# Patient Record
Sex: Female | Born: 1995 | Race: White | Hispanic: No | Marital: Single | Smoking: Never smoker
Health system: Southern US, Community
[De-identification: ages and names within clinical notes are randomized; demographics above are authoritative.]

## PROBLEM LIST (undated history)

## (undated) DIAGNOSIS — O26892 Other specified pregnancy related conditions, second trimester: Secondary | ICD-10-CM

---

## 2017-04-12 ENCOUNTER — Ambulatory Visit (INDEPENDENT_AMBULATORY_CARE_PROVIDER_SITE_OTHER): Payer: BLUE CROSS/BLUE SHIELD | Admitting: Physician Assistant

## 2017-04-12 ENCOUNTER — Encounter: Payer: Self-pay | Admitting: Physician Assistant

## 2017-04-12 VITALS — BP 106/74 | HR 88 | Temp 98.7°F | Resp 18 | Ht 61.81 in | Wt 183.0 lb

## 2017-04-12 DIAGNOSIS — Z23 Encounter for immunization: Secondary | ICD-10-CM

## 2017-04-12 MED ORDER — LEVALBUTEROL HCL 1.25 MG/0.5ML IN NEBU: 1.2500 mg | INHALATION_SOLUTION | Freq: Once | RESPIRATORY_TRACT | Status: DC

## 2017-04-12 MED ORDER — IPRATROPIUM BROMIDE 0.02 % IN SOLN: 0.5000 mg | Freq: Once | RESPIRATORY_TRACT | Status: DC

## 2017-04-12 NOTE — Patient Instructions (Addendum)
Please return for hep b in 1 month and 6 months Gardasil will be in 2 months and 6 months.       IF you received an x-ray today, you will receive an invoice from [facility]. Please contact [facility] at [phone] with questions or concerns regarding your invoice.   IF you received labwork today, you will receive an invoice from [facility]. Please contact [facility] at [phone] with questions or concerns regarding your invoice.   Our billing staff will not be able to assist you with questions regarding bills from these companies.  You will be contacted with the lab results as soon as they are available. The fastest way to get your results is to activate your My Chart account. Instructions are located on the last page of this paperwork. If you have not heard from us regarding the results in 2 weeks, please contact this office.

## 2017-04-12 NOTE — Progress Notes (Signed)
PRIMARY CARE AT [place], [place] [address] [phone]  Date:  04/12/2017   Name:  Tara Chen   DOB:  1995-08-30   MRN:  [id number]  PCP:  Patient, No Pcp Per    History of Present Illness:  Tara Chen is a 21 y.o. female patient who presents to PCP with  Chief Complaint  Patient presents with  . Immunizations    Tdap, MMR, Hep b     Patient is here for the above immunizations for school she has only 3 noted immunizations.  She reported that she has travelled around a lot young from [place].  She reports that she is going to [facility]  She has never had her gardasil shots  There are no active problems to display for this patient.   History reviewed. No pertinent past medical history.  History reviewed. No pertinent surgical history.  Social History  Substance Use Topics  . Smoking status: Never Smoker  . Smokeless tobacco: Never Used  . Alcohol use No    History reviewed. No pertinent family history.  No Known Allergies  Medication list has been reviewed and updated.  No current outpatient prescriptions on file prior to visit.   No current facility-administered medications on file prior to visit.     ROS ROS otherwise unremarkable unless listed above.  Physical Examination: BP 106/74 (BP Location: Left Arm, Patient Position: Sitting, Cuff Size: Normal)   Pulse 88   Temp 98.7 F (37.1 C) (Oral)   Resp 18   Ht 5' 1.81" (1.57 m)   Wt 183 lb (83 kg)   LMP 03/27/2017 (Approximate)   SpO2 98%   BMI 33.68 kg/m  Ideal Body Weight: Weight in (lb) to have BMI = 25: 135.6  Physical Exam  Constitutional: She is oriented to person, place, and time. She appears well-developed and well-nourished. No distress.  HENT:  Head: Normocephalic and atraumatic.  Right Ear: External ear normal.  Left Ear: External ear normal.  Eyes: Pupils are equal, round, and reactive to light. Conjunctivae and EOM are normal.  Cardiovascular:  Normal rate.   Pulmonary/Chest: Effort normal. No respiratory distress.  Neurological: She is alert and oriented to person, place, and time.  Skin: She is not diaphoretic.  Psychiatric: She has a normal mood and affect. Her behavior is normal.     Assessment and Plan: Tara Chen is a 21 y.o. female who is here today for cc of  Chief Complaint  Patient presents with  . Immunizations    Tdap, MMR, Hep b  advised regimen of return for immunizations.  ncir obtained at today's visit.   Need for Tdap vaccination - Plan: Tdap vaccine greater than or equal to 7yo IM  Need for MMR vaccine - Plan: MMR vaccine subcutaneous  Need for hepatitis B booster vaccination - Plan: Hepatitis B vaccine adult IM  Need for HPV vaccination - Plan: HPV 9-valent vaccine,Recombinat  Ivar Drape, PA-C [facility] 10/19/20189:03 AM

## 2017-04-13 NOTE — Addendum Note (Signed)
Addended by: Trena PlattENGLISH, Earl Zellmer D on: 04/13/2017 09:07 AM   Modules accepted: Level of Service

## 2017-07-30 ENCOUNTER — Ambulatory Visit (INDEPENDENT_AMBULATORY_CARE_PROVIDER_SITE_OTHER): Payer: BLUE CROSS/BLUE SHIELD | Admitting: Physician Assistant

## 2017-07-30 DIAGNOSIS — Z23 Encounter for immunization: Secondary | ICD-10-CM | POA: Diagnosis not present

## 2017-07-30 DIAGNOSIS — Z1151 Encounter for screening for human papillomavirus (HPV): Secondary | ICD-10-CM

## 2017-07-30 NOTE — Patient Instructions (Addendum)
Please return for your final round of Hep B and HPV on July 5th.

## 2017-07-30 NOTE — Progress Notes (Signed)
Pt here for Hep B and HPV Vaccine. Pt should return in 5 months for last two in the series.

## 2017-09-25 ENCOUNTER — Encounter: Payer: Self-pay | Admitting: Physician Assistant

## 2018-04-23 ENCOUNTER — Encounter (HOSPITAL_COMMUNITY): Payer: Self-pay

## 2018-04-23 ENCOUNTER — Ambulatory Visit (HOSPITAL_COMMUNITY)
Admission: EM | Admit: 2018-04-23 | Discharge: 2018-04-23 | Disposition: A | Discharge status: Home / Self Care | Payer: BLUE CROSS/BLUE SHIELD | Attending: Family Medicine | Admitting: Family Medicine

## 2018-04-23 DIAGNOSIS — H9201 Otalgia, right ear: Secondary | ICD-10-CM

## 2018-04-23 MED ORDER — CETIRIZINE HCL 10 MG PO TABS: 10.0000 mg | ORAL_TABLET | Freq: Every day | ORAL | 0 refills | Status: DC

## 2018-04-23 MED ORDER — FLUTICASONE PROPIONATE 50 MCG/ACT NA SUSP: 2.0000 | Freq: Every day | NASAL | 0 refills | Status: DC

## 2018-04-23 NOTE — Discharge Instructions (Addendum)
No signs of infection. Start flonase and zyrtec as directed for possible eustachian tube dysfunction. Follow up with ENT if symptoms not improving.

## 2018-04-23 NOTE — ED Provider Notes (Signed)
[facility]    CSN: [id number] Arrival date & time: 04/23/18  1336     History   Chief Complaint Chief Complaint  Patient presents with  . Otalgia    HPI Tara Chen is a 22 y.o. female.   22 year old female comes in for right ear pain.  States this has been going on for a year intermittently.  Usually resolves after taking ibuprofen.  However, continues to have ear fullness.  States came in for evaluation as the past 2 days, symptoms has been pretty constant.  She denies current URI symptoms such as rhinorrhea, nasal congestion, sore throat, cough.  Denies fever, chills, night sweats.  She does notice ear popping with eating, chewing.  Usually symptoms is worse when she is on the plane.  Denies recent swimming.     History reviewed. No pertinent past medical history.  There are no active problems to display for this patient.   History reviewed. No pertinent surgical history.  OB History   None      Home Medications    Prior to Admission medications   Medication Sig Start Date End Date Taking? Authorizing Provider  cetirizine (ZYRTEC) 10 MG tablet Take 1 tablet (10 mg total) by mouth daily. 04/23/18   Cathie Hoops, Zarriah Starkel V, PA-C  fluticasone (FLONASE) 50 MCG/ACT nasal spray Place 2 sprays into both nostrils daily. 04/23/18   Cathie Hoops, Keisean Skowron V, PA-C  lisdexamfetamine (VYVANSE) 20 MG capsule Take 20 mg by mouth daily.    [provider]  Norgestim-Eth Estrad Triphasic (TRI-PREVIFEM PO) Take by mouth.    [provider]    Family History History reviewed. No pertinent family history.  Social History Social History   Tobacco Use  . Smoking status: Never Smoker  . Smokeless tobacco: Never Used  Substance Use Topics  . Alcohol use: No  . Drug use: No     Allergies   Patient has no known allergies.   Review of Systems Review of Systems  Reason unable to perform ROS: See HPI as above.     Physical Exam Triage Vital Signs ED Triage Vitals    Enc Vitals Group     BP 04/23/18 1436 108/89     Pulse Rate 04/23/18 1436 69     Resp 04/23/18 1436 20     Temp 04/23/18 1436 98 F (36.7 C)     Temp Source 04/23/18 1436 Oral     SpO2 04/23/18 1436 100 %     Weight --      Height --      Head Circumference --      Peak Flow --      Pain Score 04/23/18 1437 6     Pain Loc --      Pain Edu? --      Excl. in GC? --    No data found.  Updated Vital Signs BP 108/89 (BP Location: Right Arm)   Pulse 69   Temp 98 F (36.7 C) (Oral)   Resp 20   LMP 04/19/2018   SpO2 100%   Physical Exam  Constitutional: She is oriented to person, place, and time. She appears well-developed and well-nourished. No distress.  HENT:  Head: Normocephalic and atraumatic.  Right Ear: Tympanic membrane, external ear and ear canal normal. Tympanic membrane is not erythematous and not bulging.  Left Ear: Tympanic membrane, external ear and ear canal normal. Tympanic membrane is not erythematous and not bulging.  Nose: Nose normal. Right sinus exhibits no  maxillary sinus tenderness and no frontal sinus tenderness. Left sinus exhibits no maxillary sinus tenderness and no frontal sinus tenderness.  Mouth/Throat: Uvula is midline, oropharynx is clear and moist and mucous membranes are normal.  Eyes: Pupils are equal, round, and reactive to light. Conjunctivae are normal.  Neck: Normal range of motion. Neck supple.  Cardiovascular: Normal rate, regular rhythm and normal heart sounds. Exam reveals no gallop and no friction rub.  No murmur heard. Pulmonary/Chest: Effort normal and breath sounds normal. She has no decreased breath sounds. She has no wheezes. She has no rhonchi. She has no rales.  Lymphadenopathy:    She has no cervical adenopathy.  Neurological: She is alert and oriented to person, place, and time.  Skin: Skin is warm and dry.  Psychiatric: She has a normal mood and affect. Her behavior is normal. Judgment normal.     UC Treatments / Results   Labs (all labs ordered are listed, but only abnormal results are displayed) Labs Reviewed - No data to display  EKG None  Radiology No results found.  Procedures Procedures (including critical care time)  Medications Ordered in UC Medications - No data to display  Initial Impression / Assessment and Plan / UC Course  I have reviewed the triage vital signs and the nursing notes.  Pertinent labs & imaging results that were available during my care of the patient were reviewed by me and considered in my medical decision making (see chart for details).    Will treat for possible eustachian tube dysfunction.  Flonase and Zyrtec as directed.  Information on eustachian tube dysfunction provided.  Patient to follow-up with ENT for further evaluation if symptoms not improving.  Resources provided.  Patient expresses understanding and agrees to plan.  Final Clinical Impressions(s) / UC Diagnoses   Final diagnoses:  Right ear pain    ED Prescriptions    Medication Sig Dispense Auth. Provider   fluticasone (FLONASE) 50 MCG/ACT nasal spray Place 2 sprays into both nostrils daily. 1 g Thelton Graca V, PA-C   cetirizine (ZYRTEC) 10 MG tablet Take 1 tablet (10 mg total) by mouth daily. 15 tablet Threasa Alpha, [place] 04/23/18 [phone]

## 2018-04-23 NOTE — ED Triage Notes (Signed)
Pt presents with an ongoing right ear pain.

## 2018-05-30 ENCOUNTER — Emergency Department (HOSPITAL_COMMUNITY): Payer: Worker's Compensation

## 2018-05-30 ENCOUNTER — Emergency Department (HOSPITAL_COMMUNITY)
Admission: EM | Admit: 2018-05-30 | Discharge: 2018-05-31 | Disposition: A | Discharge status: Home / Self Care | Payer: Worker's Compensation | Attending: Emergency Medicine | Admitting: Emergency Medicine

## 2018-05-30 ENCOUNTER — Encounter (HOSPITAL_COMMUNITY): Payer: Self-pay | Admitting: Emergency Medicine

## 2018-05-30 ENCOUNTER — Other Ambulatory Visit: Payer: Self-pay

## 2018-05-30 DIAGNOSIS — Y990 Civilian activity done for income or pay: Secondary | ICD-10-CM | POA: Insufficient documentation

## 2018-05-30 DIAGNOSIS — Z79899 Other long term (current) drug therapy: Secondary | ICD-10-CM | POA: Insufficient documentation

## 2018-05-30 DIAGNOSIS — Y9259 Other trade areas as the place of occurrence of the external cause: Secondary | ICD-10-CM | POA: Diagnosis not present

## 2018-05-30 DIAGNOSIS — S81812A Laceration without foreign body, left lower leg, initial encounter: Secondary | ICD-10-CM

## 2018-05-30 DIAGNOSIS — W25XXXA Contact with sharp glass, initial encounter: Secondary | ICD-10-CM | POA: Insufficient documentation

## 2018-05-30 DIAGNOSIS — Y939 Activity, unspecified: Secondary | ICD-10-CM | POA: Insufficient documentation

## 2018-05-30 MED ORDER — LIDOCAINE HCL 2 % IJ SOLN
10.0000 mL | Freq: Once | INTRAMUSCULAR | Status: AC
  Administered 2018-05-30: 200 mg
  Filled 2018-05-30: qty 20

## 2018-05-30 NOTE — ED Triage Notes (Signed)
Pt reports laceration to L outer lower leg that happened 1 hour ago, bleeding controlled. Pt reports she was lifting a box of glass bottles, didn't realize it was open on both sides and they fell out and cut her. Pt reports tetanus was received in last 1-2 years.

## 2018-05-30 NOTE — ED Provider Notes (Signed)
[facility]ENC[facility] Provider Note   CSN: [id number] Arrival date & time: 05/30/18  2021     History   Chief Complaint Chief Complaint  Patient presents with  . Laceration    HPI Tara Chen is a 22 y.o. female.  HPI  Patient is a 22 year old female with no significant past medical history presenting for laceration to the anterior lower extremity.  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  Patient reports bleeding was controlled with bandage and compression at the scene.  Patient denies any difficulty performing dorsiflexion or plantarflexion of the left lower extremity.  Denies numbness distal to the injury.  Last tetanus shot 1 to 2 years ago.  History reviewed. No pertinent past medical history.  There are no active problems to display for this patient.   History reviewed. No pertinent surgical history.   OB History   None      Home Medications    Prior to Admission medications   Medication Sig Start Date End Date Taking? Authorizing Provider  cetirizine (ZYRTEC) 10 MG tablet Take 1 tablet (10 mg total) by mouth daily. 04/23/18   Cathie HoopsYu, Amy V, PA-C  fluticasone (FLONASE) 50 MCG/ACT nasal spray Place 2 sprays into both nostrils daily. 04/23/18   Cathie HoopsYu, Amy V, PA-C  lisdexamfetamine (VYVANSE) 20 MG capsule Take 20 mg by mouth daily.    [provider]  Norgestim-Eth Estrad Triphasic (TRI-PREVIFEM PO) Take by mouth.    [provider]    Family History History reviewed. No pertinent family history.  Social History Social History   Tobacco Use  . Smoking status: Never Smoker  . Smokeless tobacco: Never Used  Substance Use Topics  . Alcohol use: No  . Drug use: No     Allergies   Patient has no known allergies.   Review of Systems Review of Systems    Musculoskeletal: Negative for arthralgias and myalgias.  Skin: Positive for wound.  Neurological: Negative for weakness and numbness.     Physical Exam Updated Vital Signs BP 121/81   Pulse 89   Temp 98.1 F (36.7 C) (Oral)   Resp 18   LMP 04/26/2018   SpO2 100%   Physical Exam  Constitutional: She appears well-developed and well-nourished. No distress.  Sitting comfortably in bed.  HENT:  Head: Normocephalic and atraumatic.  Eyes: Conjunctivae are normal. Right eye exhibits no discharge. Left eye exhibits no discharge.  EOMs normal to gross examination.  Neck: Normal range of motion.  Cardiovascular: Normal rate and regular rhythm.  Intact, 2+ DP pulse of LLE.   Pulmonary/Chest:  Normal respiratory effort. Patient converses comfortably. No audible wheeze or stridor.  Abdominal: She exhibits no distension.  Musculoskeletal: Normal range of motion.  Patient with full capacity to dorsiflex and plantarflex the left ankle.  Compartments of the left lower extremity are soft.  2+ left DP pulse distally.  Neurological: She is alert.  Cranial nerves intact to gross observation. Patient moves extremities without difficulty.  Skin: Skin is warm and dry. She is not diaphoretic.  Patient has a 3 cm linear laceration of the left shin.  Extends to subcutaneous tissue.  No muscle exposure.  Psychiatric: She has a normal mood and affect. Her behavior is normal. Judgment and thought content normal.  Nursing  note and vitals reviewed.    ED Treatments / Results  Labs (all labs ordered are listed, but only abnormal results are displayed) Labs Reviewed - No data to display  EKG None  Radiology Dg Tibia/fibula Left  Result Date: 05/30/2018 CLINICAL DATA:  22 year old female with laceration of the left lower extremity with glass. EXAM: LEFT TIBIA AND FIBULA - 2 VIEW COMPARISON:  None. FINDINGS: There is no acute fracture or dislocation. The bones are well mineralized. No arthritic  changes. Laceration of the skin anterior to the mid shin. No radiopaque foreign object. IMPRESSION: 1. No acute/traumatic osseous pathology. 2. No radiopaque foreign body. Electronically Signed   By: Elgie Collard M.D.   On: 05/30/2018 22:01    Procedures .Marland KitchenLaceration Repair Date/Time: 05/30/2018 11:51 PM Performed by: Elisha Ponder, PA-C Authorized by: Elisha Ponder, PA-C   Consent:    Consent obtained:  Verbal   Consent given by:  Patient   Risks discussed:  Pain and infection Anesthesia (see MAR for exact dosages):    Anesthesia method:  Local infiltration   Local anesthetic:  Lidocaine 2% w/o epi Laceration details:    Location:  Leg   Leg location:  L lower leg   Length (cm):  3 Repair type:    Repair type:  Simple Pre-procedure details:    Preparation:  Patient was prepped and draped in usual sterile fashion Exploration:    Hemostasis achieved with:  Direct pressure   Wound exploration: wound explored through full range of motion     Wound extent: no muscle damage noted and no tendon damage noted     Contaminated: no   Treatment:    Area cleansed with:  Saline and Betadine   Amount of cleaning:  Standard   Irrigation solution:  Sterile saline   Irrigation volume:  500 ml   Irrigation method:  Pressure wash Approximation:    Approximation:  Close Post-procedure details:    Dressing:  Non-adherent dressing   Patient tolerance of procedure:  Tolerated well, no immediate complications   (including critical care time)  Medications Ordered in ED Medications  lidocaine (XYLOCAINE) 2 % (with pres) injection 200 mg (200 mg Infiltration Given by Other 05/30/18 2327)     Initial Impression / Assessment and Plan / ED Course  I have reviewed the triage vital signs and the nursing notes.  Pertinent labs & imaging results that were available during my care of the patient were reviewed by me and considered in my medical decision making (see chart for details).      Patient well-appearing and neurovascularly intact in the left lower extremity.  Patient with superficial 3 similar laceration to the left shin.  Closed primarily with nylon suture.  Patient given suture care instructions, and instructed to return for any signs of infection including erythema, swelling or drainage.  Patient instructed to follow-up for suture removal in 7 days.  Pt denies chance of pregnancy. Recommend ibuprofen and Tylenol for symptomatic pain control.  Patient is in understanding and agrees with plan of care.  Final Clinical Impressions(s) / ED Diagnoses   Final diagnoses:  Laceration of left lower leg, initial encounter    ED Discharge Orders    None       Delia Chimes 05/30/18 2354    Azalia Bilis, MD 05/31/18 [id number]

## 2018-05-30 NOTE — Discharge Instructions (Signed)
Please see the information and instructions below regarding your visit.  Your diagnoses today include:  1. Laceration of left lower leg, initial encounter     Tests performed today include: X-ray of the affected area that did not show any foreign bodies or broken bones Vital signs. See below for your results today.   Medications prescribed:   Take any prescribed medications only as directed.  Ibuprofen alternating with Tylenol for pain.   I recommend ibuprofen a non-steroidal anti-inflammatory agent (NSAID) for pain. You may take 600 mg every 6 hours as needed for pain. If still requiring this medication around the clock for acute pain after 10 days, please see your primary healthcare provider.  Women who are pregnant, breastfeeding, or planning on becoming pregnant should not take non-steroidal anti-inflammatories such as Advil and Aleve. Tylenol is a safe over the counter pain reliever in pregnant women.  You may combine this medication with Tylenol, 650 mg every 6 hours, so you are receiving something for pain every 3 hours.  This is not a long-term medication unless under the care and direction of your primary provider. Taking this medication long-term and not under the supervision of a healthcare provider could increase the risk of stomach ulcers, kidney problems, and cardiovascular problems such as high blood pressure.    Home care instructions:  Follow any educational materials and wound care instructions contained in this packet.   Keep affected area above the level of your heart when possible to minimize swelling. Wash area gently twice a day with warm soapy water. Do not apply alcohol or hydrogen peroxide directly over a wound. Cover the area if it is draining or weeping. Keep the bandage in place for 24 hours and refrain from getting the wound wet for 24 hours. After that, you may get the area wet, but please ensure that you dry it completely afterwards.  Please refrain from  soaking sutures for long periods of time, or swimming in chlorinated water   You may apply antibiotic ointment such as Bacitracin or Neosporin.  Follow-up instructions: Suture Removal: Return to the Emergency Department or see your primary care care doctor in 7 days for a recheck of your wound and removal of your sutures or staples.    Return instructions:  Return to the Emergency Department if you have: Fever Worsening pain Worsening swelling of the wound Pus draining from the wound Redness of the skin that moves away from the wound, especially if it streaks away from the affected area  Any other emergent concerns  Your vital signs today were: BP 121/81    Pulse 89    Temp 98.1 F (36.7 C) (Oral)    Resp 18    LMP 04/26/2018    SpO2 100%  If your blood pressure (BP) was elevated on multiple readings during this visit above 130 for the top number or above 80 for the bottom number, please have this repeated by your primary care provider within one month. --------------  Thank you for allowing us to participate in your care today! It was a pleasure taking care of you.

## 2018-05-30 NOTE — ED Notes (Signed)
Patient transported to X-ray 

## 2018-06-06 ENCOUNTER — Ambulatory Visit (HOSPITAL_COMMUNITY): Admission: EM | Admit: 2018-06-06 | Discharge: 2018-06-06 | Discharge status: Absent without leave | Payer: Self-pay

## 2018-09-04 ENCOUNTER — Ambulatory Visit: Payer: BLUE CROSS/BLUE SHIELD | Admitting: Family Medicine

## 2018-09-09 ENCOUNTER — Inpatient Hospital Stay: Payer: BLUE CROSS/BLUE SHIELD

## 2018-09-09 NOTE — Other (Signed)
Pt arrives to triage with c/o cramping and green/white discharge that has a bad odor. Pt states she has not had an OB visit yet but has an appointment scheduled in [place]. States [facility]

## 2018-09-09 NOTE — Other (Signed)
Pt ambulated off of the unit undelivered

## 2018-09-09 NOTE — Other (Signed)
Pt discharged in ambulatory condition and notified to follow up with her OB provider. Pt states she has an appointment coming up in a week. All questions answered and written copy provided.

## 2018-09-09 NOTE — Other (Addendum)
Notified Dr. Mayford Knife of pt's cramping and discharge with bad odor, and that abdomen is palpating soft and no contractions seen on TOCO. Also notified him of pt's prenatal care status. Orders to discharge pt with follow up with her scheduled obstetrician.

## 2020-11-17 ENCOUNTER — Ambulatory Visit (HOSPITAL_COMMUNITY): Payer: Worker's Compensation | Admitting: Anesthesiology

## 2020-11-17 ENCOUNTER — Encounter (HOSPITAL_COMMUNITY): Payer: Self-pay | Admitting: Plastic Surgery

## 2020-11-17 ENCOUNTER — Emergency Department (HOSPITAL_COMMUNITY)
Admission: EM | Admit: 2020-11-17 | Discharge: 2020-11-17 | Payer: Worker's Compensation | Source: Home / Self Care | Attending: Emergency Medicine | Admitting: Emergency Medicine

## 2020-11-17 ENCOUNTER — Encounter (HOSPITAL_COMMUNITY)
Admission: RE | Disposition: A | Discharge status: Home / Self Care | Payer: Self-pay | Source: Ambulatory Visit | Attending: Plastic Surgery

## 2020-11-17 ENCOUNTER — Other Ambulatory Visit: Payer: Self-pay

## 2020-11-17 ENCOUNTER — Emergency Department (HOSPITAL_COMMUNITY): Payer: Worker's Compensation

## 2020-11-17 ENCOUNTER — Ambulatory Visit (HOSPITAL_COMMUNITY)
Admission: RE | Admit: 2020-11-17 | Discharge: 2020-11-17 | Disposition: A | Discharge status: Home / Self Care | Payer: Worker's Compensation | Source: Ambulatory Visit | Attending: Plastic Surgery | Admitting: Plastic Surgery

## 2020-11-17 DIAGNOSIS — S68629A Partial traumatic transphalangeal amputation of unspecified finger, initial encounter: Secondary | ICD-10-CM

## 2020-11-17 DIAGNOSIS — Z20822 Contact with and (suspected) exposure to covid-19: Secondary | ICD-10-CM | POA: Insufficient documentation

## 2020-11-17 DIAGNOSIS — S68122A Partial traumatic metacarpophalangeal amputation of right middle finger, initial encounter: Secondary | ICD-10-CM | POA: Insufficient documentation

## 2020-11-17 DIAGNOSIS — W230XXA Caught, crushed, jammed, or pinched between moving objects, initial encounter: Secondary | ICD-10-CM | POA: Insufficient documentation

## 2020-11-17 DIAGNOSIS — S68622A Partial traumatic transphalangeal amputation of right middle finger, initial encounter: Secondary | ICD-10-CM | POA: Insufficient documentation

## 2020-11-17 DIAGNOSIS — Z79899 Other long term (current) drug therapy: Secondary | ICD-10-CM | POA: Diagnosis not present

## 2020-11-17 DIAGNOSIS — R52 Pain, unspecified: Secondary | ICD-10-CM

## 2020-11-17 HISTORY — PX: AMPUTATION: SHX166

## 2020-11-17 LAB — POCT PREGNANCY, URINE: Preg Test, Ur: NEGATIVE

## 2020-11-17 LAB — RESP PANEL BY RT-PCR (FLU A&B, COVID) ARPGX2
Influenza A by PCR: NEGATIVE
Influenza B by PCR: NEGATIVE
SARS Coronavirus 2 by RT PCR: NEGATIVE

## 2020-11-17 SURGERY — AMPUTATION DIGIT
Anesthesia: Monitor Anesthesia Care | Site: Hand | Laterality: Right

## 2020-11-17 MED ORDER — FENTANYL CITRATE (PF) 250 MCG/5ML IJ SOLN
INTRAMUSCULAR | Status: AC
  Filled 2020-11-17: qty 5

## 2020-11-17 MED ORDER — FENTANYL CITRATE (PF) 100 MCG/2ML IJ SOLN
INTRAMUSCULAR | Status: DC | PRN
  Administered 2020-11-17 (×5): 50 ug via INTRAVENOUS

## 2020-11-17 MED ORDER — CHLORHEXIDINE GLUCONATE 0.12 % MT SOLN
OROMUCOSAL | Status: AC
  Administered 2020-11-17: 15 mL via OROMUCOSAL
  Filled 2020-11-17: qty 15

## 2020-11-17 MED ORDER — KETOROLAC TROMETHAMINE 30 MG/ML IJ SOLN
30.0000 mg | Freq: Once | INTRAMUSCULAR | Status: DC | PRN
Start: 2020-11-17 — End: 2020-11-17

## 2020-11-17 MED ORDER — MIDAZOLAM HCL 5 MG/5ML IJ SOLN
INTRAMUSCULAR | Status: DC | PRN
  Administered 2020-11-17: 2 mg via INTRAVENOUS

## 2020-11-17 MED ORDER — LACTATED RINGERS IV SOLN: INTRAVENOUS | Status: DC

## 2020-11-17 MED ORDER — CEFAZOLIN SODIUM-DEXTROSE 2-4 GM/100ML-% IV SOLN
INTRAVENOUS | Status: AC
  Filled 2020-11-17: qty 100

## 2020-11-17 MED ORDER — ONDANSETRON HCL 4 MG/2ML IJ SOLN
INTRAMUSCULAR | Status: DC | PRN
  Administered 2020-11-17: 4 mg via INTRAVENOUS

## 2020-11-17 MED ORDER — CEFAZOLIN SODIUM-DEXTROSE 2-4 GM/100ML-% IV SOLN
2.0000 g | INTRAVENOUS | Status: AC
  Administered 2020-11-17: 2 g via INTRAVENOUS

## 2020-11-17 MED ORDER — OXYCODONE-ACETAMINOPHEN 5-325 MG PO TABS
1.0000 | ORAL_TABLET | Freq: Once | ORAL | Status: AC
Start: 2020-11-17 — End: 2020-11-17
  Administered 2020-11-17: 1 via ORAL

## 2020-11-17 MED ORDER — PROPOFOL 500 MG/50ML IV EMUL
INTRAVENOUS | Status: DC | PRN
  Administered 2020-11-17: 75 ug/kg/min via INTRAVENOUS

## 2020-11-17 MED ORDER — BUPIVACAINE HCL (PF) 0.25 % IJ SOLN
INTRAMUSCULAR | Status: DC | PRN
  Administered 2020-11-17: 15 mL

## 2020-11-17 MED ORDER — MEPERIDINE HCL 25 MG/ML IJ SOLN: 6.2500 mg | INTRAMUSCULAR | Status: DC | PRN

## 2020-11-17 MED ORDER — ACETAMINOPHEN 10 MG/ML IV SOLN: 1000.0000 mg | Freq: Once | INTRAVENOUS | Status: DC

## 2020-11-17 MED ORDER — MIDAZOLAM HCL 2 MG/2ML IJ SOLN
INTRAMUSCULAR | Status: AC
  Filled 2020-11-17: qty 2

## 2020-11-17 MED ORDER — LIDOCAINE 2% (20 MG/ML) 5 ML SYRINGE
INTRAMUSCULAR | Status: DC | PRN
  Administered 2020-11-17: 100 mg via INTRAVENOUS

## 2020-11-17 MED ORDER — EPHEDRINE SULFATE-NACL 50-0.9 MG/10ML-% IV SOSY
PREFILLED_SYRINGE | INTRAVENOUS | Status: DC | PRN
  Administered 2020-11-17: 5 mg via INTRAVENOUS

## 2020-11-17 MED ORDER — PROMETHAZINE HCL 25 MG/ML IJ SOLN: 6.2500 mg | INTRAMUSCULAR | Status: DC | PRN

## 2020-11-17 MED ORDER — HYDROCODONE-ACETAMINOPHEN 5-325 MG PO TABS: 1.0000 | ORAL_TABLET | ORAL | 0 refills | Status: DC | PRN

## 2020-11-17 MED ORDER — POVIDONE-IODINE 10 % EX SWAB
2.0000 "application " | Freq: Once | CUTANEOUS | Status: AC
  Administered 2020-11-17: 2 via TOPICAL

## 2020-11-17 MED ORDER — OXYCODONE HCL 5 MG PO TABS: 5.0000 mg | ORAL_TABLET | Freq: Once | ORAL | Status: DC | PRN

## 2020-11-17 MED ORDER — 0.9 % SODIUM CHLORIDE (POUR BTL) OPTIME
TOPICAL | Status: DC | PRN
  Administered 2020-11-17: 1000 mL

## 2020-11-17 MED ORDER — OXYCODONE-ACETAMINOPHEN 5-325 MG PO TABS
ORAL_TABLET | ORAL | Status: AC
  Filled 2020-11-17: qty 1

## 2020-11-17 MED ORDER — CHLORHEXIDINE GLUCONATE 0.12 % MT SOLN: 15.0000 mL | Freq: Once | OROMUCOSAL | Status: AC

## 2020-11-17 MED ORDER — BUPIVACAINE-EPINEPHRINE (PF) 0.25% -1:200000 IJ SOLN
INTRAMUSCULAR | Status: AC
  Filled 2020-11-17: qty 60

## 2020-11-17 MED ORDER — PROPOFOL 10 MG/ML IV BOLUS
INTRAVENOUS | Status: DC | PRN
  Administered 2020-11-17: 30 mg via INTRAVENOUS

## 2020-11-17 MED ORDER — ORAL CARE MOUTH RINSE
15.0000 mL | Freq: Once | OROMUCOSAL | Status: AC
Start: 2020-11-17 — End: 2020-11-17

## 2020-11-17 MED ORDER — CHLORHEXIDINE GLUCONATE 4 % EX LIQD: 60.0000 mL | Freq: Once | CUTANEOUS | Status: DC

## 2020-11-17 MED ORDER — HYDROMORPHONE HCL 1 MG/ML IJ SOLN: 0.2500 mg | INTRAMUSCULAR | Status: DC | PRN

## 2020-11-17 MED ORDER — OXYCODONE HCL 5 MG/5ML PO SOLN: 5.0000 mg | Freq: Once | ORAL | Status: DC | PRN

## 2020-11-17 SURGICAL SUPPLY — 41 items
BNDG COHESIVE 1X5 TAN STRL LF (GAUZE/BANDAGES/DRESSINGS) ×2 IMPLANT
BNDG CONFORM 2 STRL LF (GAUZE/BANDAGES/DRESSINGS) ×2 IMPLANT
BNDG ELASTIC 2X5.8 VLCR STR LF (GAUZE/BANDAGES/DRESSINGS) ×2 IMPLANT
BNDG ELASTIC 3X5.8 VLCR STR LF (GAUZE/BANDAGES/DRESSINGS) IMPLANT
BNDG ELASTIC 4X5.8 VLCR STR LF (GAUZE/BANDAGES/DRESSINGS) IMPLANT
BNDG GAUZE ELAST 4 BULKY (GAUZE/BANDAGES/DRESSINGS) ×2 IMPLANT
CORD BIPOLAR FORCEPS 12FT (ELECTRODE) ×2 IMPLANT
COVER SURGICAL LIGHT HANDLE (MISCELLANEOUS) ×2 IMPLANT
COVER WAND RF STERILE (DRAPES) ×2 IMPLANT
CUFF TOURN SGL QUICK 18X4 (TOURNIQUET CUFF) ×2 IMPLANT
CUFF TOURN SGL QUICK 24 (TOURNIQUET CUFF)
CUFF TRNQT CYL 24X4X16.5-23 (TOURNIQUET CUFF) IMPLANT
DRAPE SURG 17X23 STRL (DRAPES) ×2 IMPLANT
DRSG ADAPTIC 3X8 NADH LF (GAUZE/BANDAGES/DRESSINGS) ×2 IMPLANT
GAUZE SPONGE 2X2 8PLY STRL LF (GAUZE/BANDAGES/DRESSINGS) IMPLANT
GAUZE SPONGE 4X4 12PLY STRL (GAUZE/BANDAGES/DRESSINGS) ×2 IMPLANT
GAUZE XEROFORM 1X8 LF (GAUZE/BANDAGES/DRESSINGS) ×2 IMPLANT
GOWN STRL REUS W/ TWL LRG LVL3 (GOWN DISPOSABLE) ×2 IMPLANT
GOWN STRL REUS W/ TWL XL LVL3 (GOWN DISPOSABLE) ×1 IMPLANT
GOWN STRL REUS W/TWL LRG LVL3 (GOWN DISPOSABLE) ×4
GOWN STRL REUS W/TWL XL LVL3 (GOWN DISPOSABLE) ×2
KIT BASIN OR (CUSTOM PROCEDURE TRAY) ×2 IMPLANT
KIT TURNOVER KIT B (KITS) ×2 IMPLANT
MANIFOLD NEPTUNE II (INSTRUMENTS) ×2 IMPLANT
NEEDLE HYPO 25GX1X1/2 BEV (NEEDLE) IMPLANT
NS IRRIG 1000ML POUR BTL (IV SOLUTION) ×2 IMPLANT
PACK GENERAL/GYN (CUSTOM PROCEDURE TRAY) ×2 IMPLANT
PAD ARMBOARD 7.5X6 YLW CONV (MISCELLANEOUS) ×4 IMPLANT
PAD CAST 4YDX4 CTTN HI CHSV (CAST SUPPLIES) IMPLANT
PADDING CAST COTTON 4X4 STRL (CAST SUPPLIES)
SOAP 2 % CHG 4 OZ (WOUND CARE) ×2 IMPLANT
SPONGE GAUZE 2X2 STER 10/PKG (GAUZE/BANDAGES/DRESSINGS)
SUCTION FRAZIER HANDLE 10FR (MISCELLANEOUS)
SUCTION TUBE FRAZIER 10FR DISP (MISCELLANEOUS) IMPLANT
SUT MERSILENE 4 0 P 3 (SUTURE) IMPLANT
SUT PROLENE 4 0 PS 2 18 (SUTURE) IMPLANT
SYR CONTROL 10ML LL (SYRINGE) IMPLANT
TOWEL GREEN STERILE (TOWEL DISPOSABLE) ×2 IMPLANT
TOWEL GREEN STERILE FF (TOWEL DISPOSABLE) ×2 IMPLANT
TUBE CONNECTING 12X1/4 (SUCTIONS) IMPLANT
WATER STERILE IRR 1000ML POUR (IV SOLUTION) ×2 IMPLANT

## 2020-11-17 NOTE — Discharge Instructions (Signed)
Activity: As tolerated, but avoid strenuous activity until follow up visit.  Diet: Regular  Wound Care: Keep dressing clean & dry for 2 days.  After that you can remove dressing and shower normally.  Redress the wound for protection until follow-up visit.  Special Instructions:  Call our office if any unusual problems occur such as pain, excessive bleeding, unrelieved nausea/vomiting, fever &/or chills.  Follow-up appointment: Scheduled for next week.

## 2020-11-17 NOTE — ED Provider Notes (Signed)
[facility]-EMERGENCY DEPT Provider Note   CSN: [id number] Arrival date & time: 11/17/20  1347     History No chief complaint on file.   Tara Chen is a 25 y.o. female.  Patient is a healthy 25 year old female who presents today after closing her finger in the door.  The tip of her right middle finger is dangling and has a laceration.  She has pain in the tip of her finger but no other injury.  Tetanus shot is up-to-date.  The history is provided by the patient.       No past medical history on file.  There are no problems to display for this patient.   No past surgical history on file.   OB History   No obstetric history on file.     No family history on file.  Social History   Tobacco Use  . Smoking status: Never Smoker  . Smokeless tobacco: Never Used  Vaping Use  . Vaping Use: Never used  Substance Use Topics  . Alcohol use: No  . Drug use: No    Home Medications Prior to Admission medications   Medication Sig Start Date End Date Taking? Authorizing Provider  cetirizine (ZYRTEC) 10 MG tablet Take 1 tablet (10 mg total) by mouth daily. 04/23/18   Cathie Hoops, Amy V, PA-C  fluticasone (FLONASE) 50 MCG/ACT nasal spray Place 2 sprays into both nostrils daily. 04/23/18   Cathie Hoops, Amy V, PA-C  lisdexamfetamine (VYVANSE) 20 MG capsule Take 20 mg by mouth daily.    [provider]  Norgestim-Eth Estrad Triphasic (TRI-PREVIFEM PO) Take by mouth.    [provider]    Allergies    Patient has no known allergies.  Review of Systems   Review of Systems  All other systems reviewed and are negative.   Physical Exam Updated Vital Signs BP 122/76 (BP Location: Left Arm)   Pulse 99   Temp 99.5 F (37.5 C) (Oral)   Resp 20   Ht 5\' 2"  (1.575 m)   Wt 83 kg   SpO2 98%   BMI 33.47 kg/m   Physical Exam Vitals and nursing note reviewed.  Constitutional:      General: She is not in acute distress.    Appearance: Normal  appearance.     Comments: Tearful  HENT:     Head: Normocephalic.     Nose: Nose normal.  Eyes:     Pupils: Pupils are equal, round, and reactive to light.  Cardiovascular:     Rate and Rhythm: Normal rate.     Pulses: Normal pulses.  Pulmonary:     Effort: Pulmonary effort is normal.  Musculoskeletal:        General: Tenderness and signs of injury present.       Hands:  Skin:    General: Skin is warm.  Neurological:     Mental Status: She is alert and oriented to person, place, and time. Mental status is at baseline.  Psychiatric:        Mood and Affect: Mood normal.        Behavior: Behavior normal.           ED Results / Procedures / Treatments   Labs (all labs ordered are listed, but only abnormal results are displayed) Labs Reviewed - No data to display  EKG None  Radiology DG Finger Middle Right  Result Date: 11/17/2020 CLINICAL DATA:  Crush injury to distal aspect of right third finger. EXAM: RIGHT  MIDDLE FINGER 2+V COMPARISON:  None. FINDINGS: Displaced transverse fracture of the tuft of the right third distal phalanx with approximately 3 mm of displacement on the lateral view. Associated soft tissue injury. No evidence of dislocation. IMPRESSION: Displaced fracture of the tuft of the right third distal phalanx with approximately 3 mm of displacement and associated soft tissue injury. Electronically Signed   By: Irish Lack M.D.   On: 11/17/2020 14:50    Procedures Procedures   Medications Ordered in ED Medications - No data to display  ED Course  I have reviewed the triage vital signs and the nursing notes.  Pertinent labs & imaging results that were available during my care of the patient were reviewed by me and considered in my medical decision making (see chart for details).    MDM Rules/Calculators/A&P                          Patient presenting today with partial amputation of the distal finger.  Proximal nail is intact.  Mild decreased  sensation in the distal portion of the finger where injury is involved.  No other evidence of injury.  X-rays are pending.  Will discuss with hand surgery.  3:06 PM X-ray shows displaced fracture of the tuft of the right third distal phalanx with a proximal 3 mm of displacement.  Spoke with [facility]red and they desire for patient to go to [facility] to short stay so Dr. Arita Miss can evaluate and treat.  COVID swab was done.  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

## 2020-11-17 NOTE — Op Note (Signed)
Operative Note   DATE OF OPERATION: 11/17/2020  SURGICAL DEPARTMENT: Plastic Surgery  PREOPERATIVE DIAGNOSES: Partial amputation right long finger  POSTOPERATIVE DIAGNOSES:  same  PROCEDURE: 1.  Right long finger nailbed repair  2.  Complex repair right long finger totaling 2 cm  SURGEON: Ancil Linsey, MD  ASSISTANT: None  ANESTHESIA:  General.   COMPLICATIONS: None.   INDICATIONS FOR PROCEDURE:  The patient, Tara Chen is a 25 y.o. female born on Nov 01, 1995, is here for treatment of partial amputation right long fingertip MRN: [id number]  CONSENT:  Informed consent was obtained directly from the patient. Risks, benefits and alternatives were fully discussed. Specific risks including but not limited to bleeding, infection, hematoma, seroma, scarring, pain, contracture, asymmetry, wound healing problems, and need for further surgery were all discussed. The patient did have an ample opportunity to have questions answered to satisfaction.   DESCRIPTION OF PROCEDURE:  The patient was taken to the operating room. SCDs were placed and antibiotics were given.  Local anesthesia was administered.  The patient's operative site was prepped and draped in a sterile fashion. A time out was performed and all information was confirmed to be correct.  Started by exsanguinating the arm with gravity inflated the tourniquet to 250 mmHg.  I then inspected the wound which demonstrated a partial amputation but the part was still attached by his volar soft tissues.  I could not identify a substantial enough bone fragment distally to justify any sort of pin fixation.  The wound was then irrigated copiously with saline.  The nail plate was then removed to facilitate the nailbed repair.  The radial and ulnar soft tissues were then reapproximated in layers with interrupted buried 4-0 chromic sutures and simple interrupted chromic sutures on the surface after conservative debridement of any devitalized  tissue.  Nailbed was then repaired with interrupted 5-0 Vicryl sutures.  Soft dressing was applied.  Tourniquet was let down.  The patient tolerated the procedure well.  There were no complications. The patient was allowed to wake from anesthesia, extubated and taken to the recovery room in satisfactory condition.

## 2020-11-17 NOTE — ED Triage Notes (Addendum)
Pt caught the tip of her R middle finger in a door about 20 min ago, appears partially detached at nailbed.

## 2020-11-17 NOTE — Interval H&P Note (Signed)
History and Physical Interval Note:  11/17/2020 4:38 PM  Tara Chen  has presented today for surgery, with the diagnosis of PAIN.  The various methods of treatment have been discussed with the patient and family. After consideration of risks, benefits and other options for treatment, the patient has consented to  Procedure(s): REV ISION OF AMPUTATION LONG FINGER (Right) as a surgical intervention.  The patient's history has been reviewed, patient examined, no change in status, stable for surgery.  I have reviewed the patient's chart and labs.  Questions were answered to the patient's satisfaction.     [name]

## 2020-11-17 NOTE — Brief Op Note (Signed)
11/17/2020  6:33 PM  PATIENT:  Tara Chen  25 y.o. female  PRE-OPERATIVE DIAGNOSIS:  PAIN  POST-OPERATIVE DIAGNOSIS:  PAIN  PROCEDURE:  Procedure(s): REVISION OF AMPUTATION LONG FINGER (Right)  SURGEON:  Surgeon(s) and Role:    * Tiffinie Caillier, Wendy Poet, MD - Primary  PHYSICIAN ASSISTANT: None  ASSISTANTS: none   ANESTHESIA:   local  EBL:  5   BLOOD ADMINISTERED:none  DRAINS: none   LOCAL MEDICATIONS USED:  MARCAINE     SPECIMEN:  No Specimen  DISPOSITION OF SPECIMEN:  N/A  COUNTS:  YES  TOURNIQUET:  * Missing tourniquet times found for documented tourniquets in log: [id number] *  DICTATION: .Dragon Dictation  PLAN OF CARE: Discharge to home after PACU  PATIENT DISPOSITION:  PACU - hemodynamically stable.   Delay start of Pharmacological VTE agent (>24hrs) due to surgical blood loss or risk of bleeding: not applicable

## 2020-11-17 NOTE — Transfer of Care (Signed)
Immediate Anesthesia Transfer of Care Note  Patient: Tara Chen  Procedure(s) Performed: REVISION OF AMPUTATION LONG FINGER (Right Hand)  Patient Location: PACU  Anesthesia Type:MAC  Level of Consciousness: awake and patient cooperative  Airway & Oxygen Therapy: Patient Spontanous Breathing  Post-op Assessment: Report given to RN and Post -op Vital signs reviewed and stable  Post vital signs: Reviewed and stable  Last Vitals:  Vitals Value Taken Time  BP 111/67 11/17/20 1835  Temp    Pulse 109 11/17/20 1839  Resp 12 11/17/20 1839  SpO2 100 % 11/17/20 1839  Vitals shown include unvalidated device data.  Last Pain:  Vitals:   11/17/20 1629  TempSrc: Oral  PainSc: 8          Complications: No complications documented.

## 2020-11-17 NOTE — Anesthesia Preprocedure Evaluation (Signed)
Anesthesia Evaluation  Patient identified by MRN, date of birth, ID band Patient awake    Reviewed: Allergy & Precautions, NPO status , Patient's Chart, lab work & pertinent test results  Airway Mallampati: I  TM Distance: >3 FB Neck ROM: Full    Dental no notable dental hx. (+) Teeth Intact, Dental Advisory Given   Pulmonary neg pulmonary ROS,    Pulmonary exam normal breath sounds clear to auscultation       Cardiovascular negative cardio ROS Normal cardiovascular exam Rhythm:Regular Rate:Normal     Neuro/Psych negative neurological ROS  negative psych ROS   GI/Hepatic negative GI ROS, Neg liver ROS,   Endo/Other  Obesity BMI 34  Renal/GU negative Renal ROS  negative genitourinary   Musculoskeletal negative musculoskeletal ROS (+)   Abdominal   Peds negative pediatric ROS (+)  Hematology negative hematology ROS (+)   Anesthesia Other Findings Partial amputation tip of right long finger s/p closing in door  Reproductive/Obstetrics negative OB ROS                             Anesthesia Physical Anesthesia Plan  ASA: I  Anesthesia Plan: MAC   Post-op Pain Management:    Induction:   PONV Risk Score and Plan: 2 and Propofol infusion and TIVA  Airway Management Planned: Natural Airway and Simple Face Mask  Additional Equipment: None  Intra-op Plan:   Post-operative Plan:   Informed Consent: I have reviewed the patients History and Physical, chart, labs and discussed the procedure including the risks, benefits and alternatives for the proposed anesthesia with the patient or authorized representative who has indicated his/her understanding and acceptance.       Plan Discussed with: CRNA  Anesthesia Plan Comments: (Digital block per surgeon )        Anesthesia Quick Evaluation

## 2020-11-17 NOTE — H&P (Signed)
  Reason for Consult/CC: Right long finger injury  Tara Chen is an 25 y.o. female.  HPI: Patient presents with a crush injury to her right long finger.  This got caught in the door.  She had a near amputation with transverse fracture of the distal phalanx and a nailbed laceration.  She presents for treatment of these injuries.  Allergies: No Known Allergies  Medications:  Current Facility-Administered Medications:  .  oxyCODONE-acetaminophen (PERCOCET/ROXICET) 5-325 MG per tablet, , , ,  .  ceFAZolin (ANCEF) 2-4 GM/100ML-% IVPB, , , ,  .  ceFAZolin (ANCEF) IVPB 2g/100 mL premix, 2 g, Intravenous, On Call to OR, Freeman Caldron, PA-C .  chlorhexidine (HIBICLENS) 4 % liquid 4 application, 60 mL, Topical, Once, Freeman Caldron, PA-C .  lactated ringers infusion, , Intravenous, Continuous, Beryle Lathe, MD, Last Rate: 10 mL/hr at 11/17/20 1635, New Bag at 11/17/20 1635  History reviewed. No pertinent past medical history.  History reviewed. No pertinent surgical history.  History reviewed. No pertinent family history.  Social History:  reports that she has never smoked. She has never used smokeless tobacco. She reports that she does not drink alcohol and does not use drugs.  Physical Exam Last menstrual period 11/05/2020. General: No acute distress, alert oriented Right hand: Fingers well-perfused and capillary refill palp radial pulse.  Sensation is intact throughout.  She has a partial amputation of the long finger at the proximal third of the nailbed.  It still attached by the volar soft tissues.  She claims to have sensation in the area and it looks to have normal color and capillary refill.  She can flex and extend at the DIP joint.  No results found for this or any previous visit (from the past 48 hour(s)).  DG Finger Middle Right  Result Date: 11/17/2020 CLINICAL DATA:  Crush injury to distal aspect of right third finger. EXAM: RIGHT MIDDLE FINGER 2+V COMPARISON:   None. FINDINGS: Displaced transverse fracture of the tuft of the right third distal phalanx with approximately 3 mm of displacement on the lateral view. Associated soft tissue injury. No evidence of dislocation. IMPRESSION: Displaced fracture of the tuft of the right third distal phalanx with approximately 3 mm of displacement and associated soft tissue injury. Electronically Signed   By: Irish Lack M.D.   On: 11/17/2020 14:50    Assessment/Plan: Patient presents with a crush injury to the fingertip with partial amputation.  The part looks vascularized so we will plan to repair the nailbed and repair the soft tissues.  I suspect this will go on to heal okay.  We discussed the risks and benefits of surgery to include bleeding, infection, damage to surrounding structures and need for additional procedures.  We discussed the potential for delayed compromise of the soft tissues after crush injury.  All of her questions were answered we will plan to move forward.  Tara Chen 11/17/2020, 4:36 PM

## 2020-11-17 NOTE — Discharge Instructions (Signed)
Go to the main hospital entrance and tell them you need to go to short stay for Dr. Arita Miss.  Do not eat or drink anything before you get there.

## 2020-11-18 ENCOUNTER — Encounter (HOSPITAL_COMMUNITY): Payer: Self-pay | Admitting: Plastic Surgery

## 2020-11-18 NOTE — Anesthesia Postprocedure Evaluation (Signed)
Anesthesia Post Note  Patient: Tara Chen  Procedure(s) Performed: REVISION OF AMPUTATION LONG FINGER (Right Hand)     Patient location during evaluation: PACU Anesthesia Type: MAC Level of consciousness: awake and alert Pain management: pain level controlled Vital Signs Assessment: post-procedure vital signs reviewed and stable Respiratory status: spontaneous breathing, nonlabored ventilation and respiratory function stable Cardiovascular status: stable and blood pressure returned to baseline Anesthetic complications: no   No complications documented.  Last Vitals:  Vitals:   11/17/20 1850 11/17/20 1905  BP: 107/70 107/75  Pulse: 84 84  Resp: 20 18  Temp:  (!) 36.2 C  SpO2: 100% 100%    Last Pain:  Vitals:   11/17/20 1905  TempSrc:   PainSc: 0-No pain                 Audry Pili

## 2020-11-24 ENCOUNTER — Other Ambulatory Visit: Payer: Self-pay

## 2020-11-24 ENCOUNTER — Ambulatory Visit (INDEPENDENT_AMBULATORY_CARE_PROVIDER_SITE_OTHER): Payer: PRIVATE HEALTH INSURANCE | Admitting: Plastic Surgery

## 2020-11-24 DIAGNOSIS — S62632A Displaced fracture of distal phalanx of right middle finger, initial encounter for closed fracture: Secondary | ICD-10-CM

## 2020-11-24 NOTE — Progress Notes (Signed)
Patient presents about 1 week postop from complex repair and nailbed repair of the right long finger.  This was crushed in a door and partially amputated.  It was still attached by the volar soft tissues and did appear to have sensation and a blood supply.  Today she presents in the partially amputated part does appear to have good blood flow.  The incisions are intact with no signs of infection.  She has mild tenderness which would be expected.  We will plan to continue to protect this for another few weeks and she will gradually increase her activity with pain as her guide.  I brought up a Splint at therapy but she preferred to go with a soft bulky her dressing to protect it in the meantime.  I will plan to see her again in about 1 month.  All of her questions were answered.

## 2020-12-24 ENCOUNTER — Other Ambulatory Visit: Payer: Self-pay

## 2020-12-24 ENCOUNTER — Ambulatory Visit (INDEPENDENT_AMBULATORY_CARE_PROVIDER_SITE_OTHER): Payer: PRIVATE HEALTH INSURANCE | Admitting: Surgical

## 2020-12-24 DIAGNOSIS — S62632A Displaced fracture of distal phalanx of right middle finger, initial encounter for closed fracture: Secondary | ICD-10-CM

## 2020-12-24 NOTE — Progress Notes (Signed)
Patient is a 25 year old female here for follow-up after revision of amputation of long finger with Dr. Arita Miss on 11/17/2020.  She is 5 weeks postop.  She reports overall she is doing really well, she reports that range of motion has been improving.  She reports some decrease sensation of the distal long finger.  She has been keeping it covered with some gauze and Coban.  On exam she is able to move all 5 digits.  She has some decreased flexion of the right long finger, but this is very minimal.  She is able to extend all 5 digits.  She has good cap refill of the distal long finger.  She has a palpable radial pulse.  There is no signs of infection.  Incisions are intact.  Mild tenderness.  Recommend Vaseline to the incision and nailbed to help with the sutures absorbing.  I was able to remove some of the sutures today, she tolerated this well.  Avoid submerging this in pool water or ocean water.  There is no sign of infection.  Recommend following up in 6 weeks for reevaluation.  Recommend calling with questions or concerns.  Pictures were taken and placed in the patient's chart with patient's permission

## 2021-02-01 ENCOUNTER — Other Ambulatory Visit: Payer: Self-pay

## 2021-02-01 ENCOUNTER — Encounter: Payer: Self-pay | Admitting: Surgical

## 2021-02-01 ENCOUNTER — Ambulatory Visit (INDEPENDENT_AMBULATORY_CARE_PROVIDER_SITE_OTHER): Payer: Self-pay | Admitting: Surgical

## 2021-02-01 DIAGNOSIS — S62632A Displaced fracture of distal phalanx of right middle finger, initial encounter for closed fracture: Secondary | ICD-10-CM

## 2021-02-01 NOTE — Progress Notes (Signed)
Patient is a 25 year old female here for follow-up after revision of amputation of right long finger with Dr. Arita Miss on 11/17/2020.  She is 2 and half months postop. She reports overall she is doing really well.  She is pleased with how things are healing so far.  She is not having any concerns.  She reports sensation is normal.  On exam right long finger with good color and capillary refill, sensation is intact distally.  There is no erythema.  Incision is well-healed.  Nailbed is beginning to regrow.  She does have some scarring noted.  No wounds noted.  Palpable radial pulses noted.  Recommend continuing to monitor nailbed healing, recommend following up on an as-needed basis.  Recommend calling with questions or concerns.  Pictures were taken and placed in the patient's chart with patient's permission.

## 2021-12-14 IMAGING — DX DG FINGER MIDDLE 2+V*R*
3 series · 3 of 3 positions shown · non-contrast
Comparison: None.

CLINICAL DATA: Crush injury to distal aspect of right third finger.

EXAM:
RIGHT MIDDLE FINGER 2+V

[finger ap]
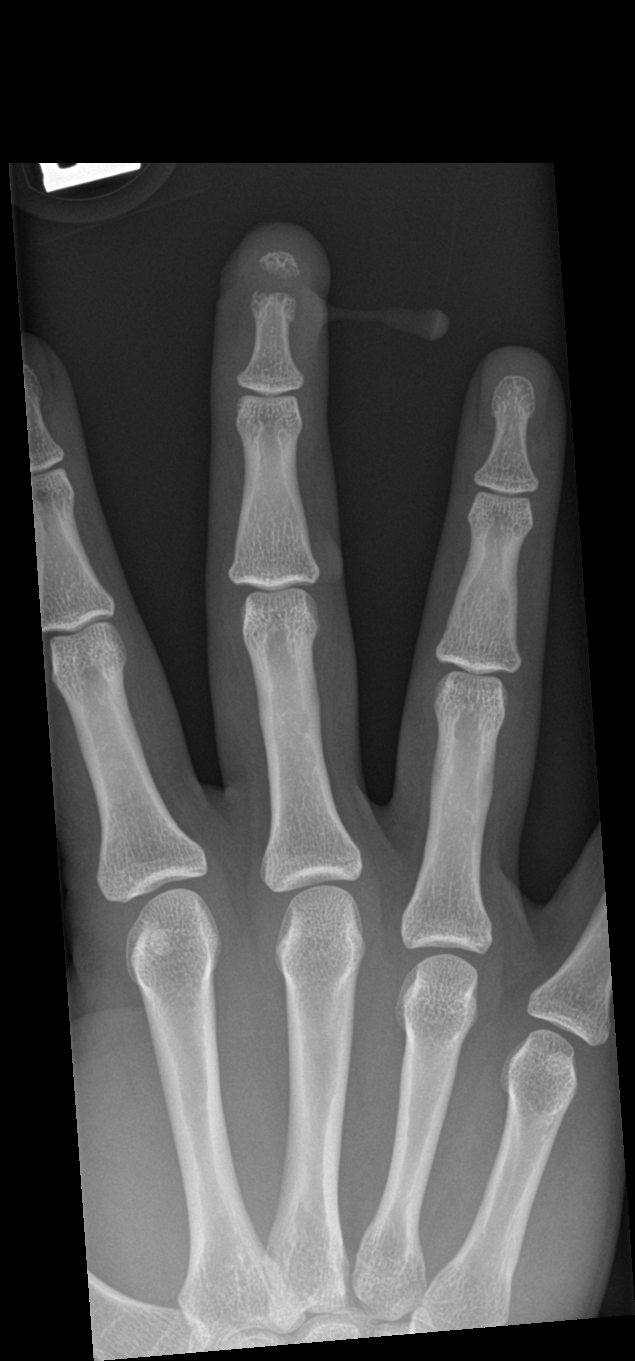

[finger obl]
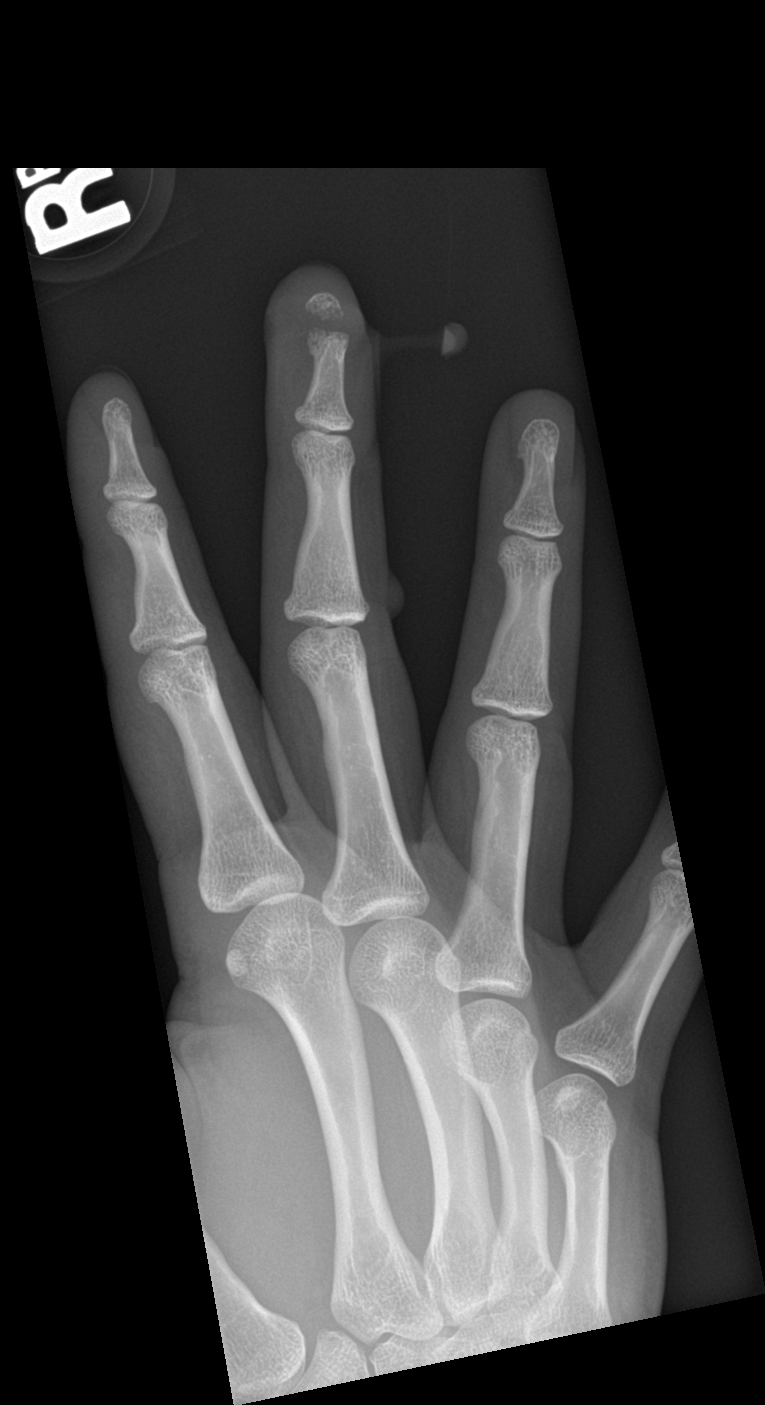

[finger lat]
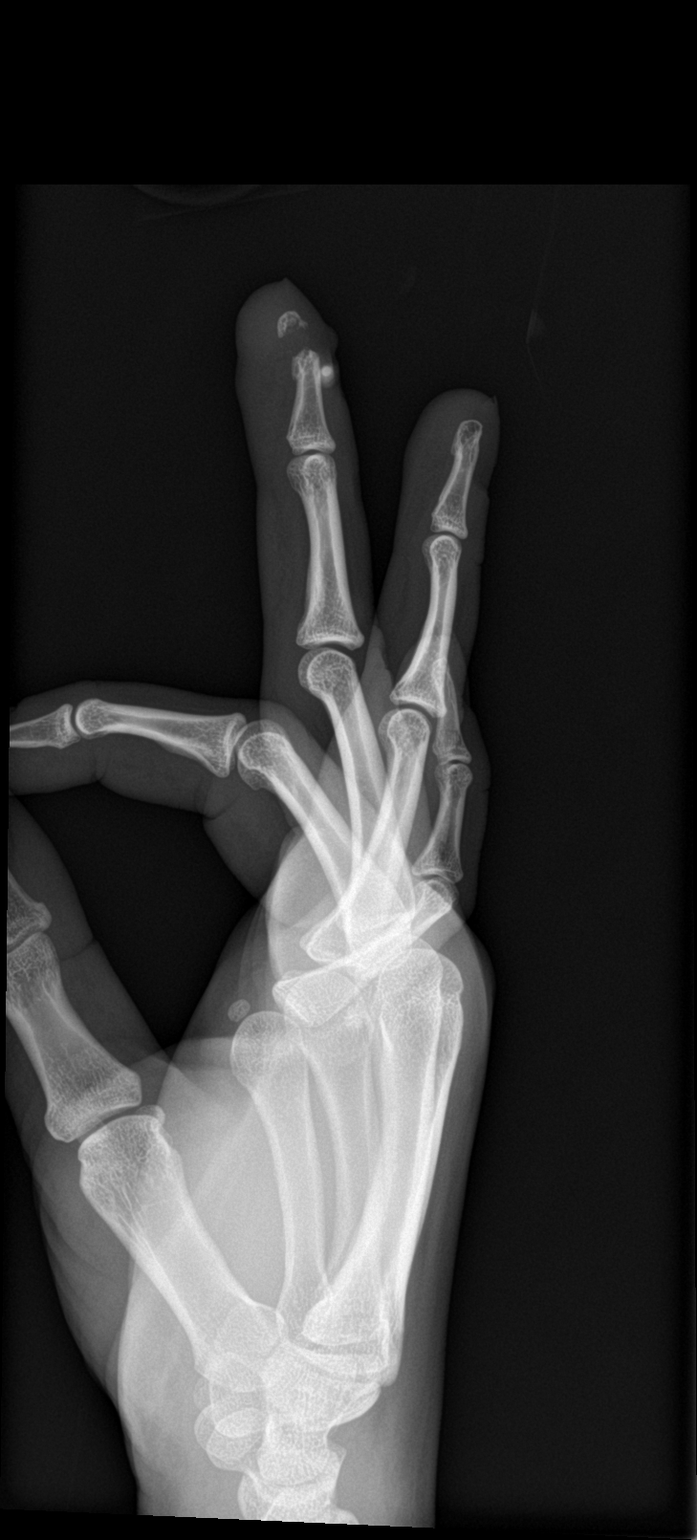

[3 of 3 positions shown; findings below may reference images not displayed]

FINDINGS: Displaced transverse fracture of the tuft of the right third distal
phalanx with approximately 3 mm of displacement on the lateral view.
Associated soft tissue injury. No evidence of dislocation.
IMPRESSION: Displaced fracture of the tuft of the right third distal phalanx
with approximately 3 mm of displacement and associated soft tissue
injury.
